# Patient Record
Sex: Female | Born: 1945 | Race: White | Hispanic: No | Marital: Married
Health system: Southern US, Community
[De-identification: ages and names within clinical notes are randomized; demographics above are authoritative.]

---

## 2017-10-30 ENCOUNTER — Emergency Department (HOSPITAL_COMMUNITY): Payer: Medicare Other

## 2017-10-30 ENCOUNTER — Emergency Department (HOSPITAL_COMMUNITY)
Admission: EM | Admit: 2017-10-30 | Discharge: 2017-10-30 | Disposition: A | Discharge status: Home / Self Care | Payer: Medicare Other | Attending: Emergency Medicine | Admitting: Emergency Medicine

## 2017-10-30 DIAGNOSIS — Z944 Liver transplant status: Secondary | ICD-10-CM | POA: Insufficient documentation

## 2017-10-30 DIAGNOSIS — I2510 Atherosclerotic heart disease of native coronary artery without angina pectoris: Secondary | ICD-10-CM | POA: Insufficient documentation

## 2017-10-30 DIAGNOSIS — Z7984 Long term (current) use of oral hypoglycemic drugs: Secondary | ICD-10-CM | POA: Diagnosis not present

## 2017-10-30 DIAGNOSIS — Z79899 Other long term (current) drug therapy: Secondary | ICD-10-CM | POA: Diagnosis not present

## 2017-10-30 DIAGNOSIS — Z7982 Long term (current) use of aspirin: Secondary | ICD-10-CM | POA: Diagnosis not present

## 2017-10-30 DIAGNOSIS — E119 Type 2 diabetes mellitus without complications: Secondary | ICD-10-CM | POA: Diagnosis not present

## 2017-10-30 DIAGNOSIS — R55 Syncope and collapse: Secondary | ICD-10-CM | POA: Insufficient documentation

## 2017-10-30 DIAGNOSIS — I10 Essential (primary) hypertension: Secondary | ICD-10-CM | POA: Insufficient documentation

## 2017-10-30 LAB — I-STAT TROPONIN, ED
Troponin i, poc: 0 ng/mL (ref 0.00–0.08)
Troponin i, poc: 0 ng/mL (ref 0.00–0.08)

## 2017-10-30 LAB — COMPREHENSIVE METABOLIC PANEL
ALBUMIN: 3.5 g/dL (ref 3.5–5.0)
ALT: 20 U/L (ref 14–54)
AST: 26 U/L (ref 15–41)
Alkaline Phosphatase: 77 U/L (ref 38–126)
Anion gap: 13 (ref 5–15)
BUN: 11 mg/dL (ref 6–20)
CHLORIDE: 93 mmol/L — AB (ref 101–111)
CO2: 28 mmol/L (ref 22–32)
CREATININE: 1.1 mg/dL — AB (ref 0.44–1.00)
Calcium: 9.3 mg/dL (ref 8.9–10.3)
GFR calc Af Amer: 58 mL/min — ABNORMAL LOW (ref >60)
GFR calc non Af Amer: 50 mL/min — ABNORMAL LOW (ref >60)
GLUCOSE: 101 mg/dL — AB (ref 65–99)
POTASSIUM: 4 mmol/L (ref 3.5–5.1)
Sodium: 134 mmol/L — ABNORMAL LOW (ref 135–145)
Total Bilirubin: 1 mg/dL (ref 0.3–1.2)
Total Protein: 7.3 g/dL (ref 6.5–8.1)

## 2017-10-30 LAB — CBC WITH DIFFERENTIAL/PLATELET
Basophils Absolute: 0 10*3/uL (ref 0.0–0.1)
Basophils Relative: 0 %
EOS ABS: 0.2 10*3/uL (ref 0.0–0.7)
EOS PCT: 1 %
HCT: 36 % (ref 36.0–46.0)
Hemoglobin: 12.1 g/dL (ref 12.0–15.0)
LYMPHS ABS: 1.2 10*3/uL (ref 0.7–4.0)
LYMPHS PCT: 8 %
MCH: 30.6 pg (ref 26.0–34.0)
MCHC: 33.6 g/dL (ref 30.0–36.0)
MCV: 90.9 fL (ref 78.0–100.0)
MONOS PCT: 8 %
Monocytes Absolute: 1.3 10*3/uL — ABNORMAL HIGH (ref 0.1–1.0)
Neutro Abs: 13.2 10*3/uL — ABNORMAL HIGH (ref 1.7–7.7)
Neutrophils Relative %: 83 %
PLATELETS: 196 10*3/uL (ref 150–400)
RBC: 3.96 MIL/uL (ref 3.87–5.11)
RDW: 13.6 % (ref 11.5–15.5)
WBC: 15.9 10*3/uL — ABNORMAL HIGH (ref 4.0–10.5)

## 2017-10-30 LAB — I-STAT CG4 LACTIC ACID, ED: LACTIC ACID, VENOUS: 1.39 mmol/L (ref 0.5–1.9)

## 2017-10-30 MED ORDER — AMOXICILLIN-POT CLAVULANATE 875-125 MG PO TABS: 1.0000 | ORAL_TABLET | Freq: Two times a day (BID) | ORAL | 0 refills | Status: AC

## 2017-10-30 MED ORDER — ACETAMINOPHEN 325 MG PO TABS: 650.0000 mg | ORAL_TABLET | Freq: Once | ORAL | Status: DC

## 2017-10-30 MED ORDER — VANCOMYCIN HCL IN DEXTROSE 750-5 MG/150ML-% IV SOLN
750.0000 mg | Freq: Two times a day (BID) | INTRAVENOUS | Status: DC
  Filled 2017-10-30: qty 150

## 2017-10-30 MED ORDER — VANCOMYCIN HCL IN DEXTROSE 1-5 GM/200ML-% IV SOLN: 1000.0000 mg | Freq: Once | INTRAVENOUS | Status: DC

## 2017-10-30 MED ORDER — SODIUM CHLORIDE 0.9 % IV SOLN
2.0000 g | Freq: Once | INTRAVENOUS | Status: AC
  Administered 2017-10-30: 2 g via INTRAVENOUS
  Filled 2017-10-30: qty 2

## 2017-10-30 MED ORDER — SODIUM CHLORIDE 0.9 % IV SOLN: 2.0000 g | INTRAVENOUS | Status: DC

## 2017-10-30 MED ORDER — VANCOMYCIN HCL 10 G IV SOLR
1250.0000 mg | Freq: Once | INTRAVENOUS | Status: AC
  Administered 2017-10-30: 1250 mg via INTRAVENOUS
  Filled 2017-10-30: qty 1250

## 2017-10-30 NOTE — ED Notes (Signed)
Patient transported to X-ray 

## 2017-10-30 NOTE — ED Notes (Signed)
Family at bedside. 

## 2017-10-30 NOTE — Discharge Instructions (Signed)
Please take all of your antibiotics until finished!   You may develop abdominal discomfort or diarrhea from the antibiotic.  You may help offset this with probiotics which you can buy or get in yogurt. Do not eat  or take the probiotics until 2 hours after your antibiotic.  Alternate ibuprofen and Tylenol as needed for fever.  Drink plenty of water and get plenty of rest.  [facility] for [facility]evaluation as soon as possible.  Return to the emergency department immediately for any concerning signs or symptoms develop such as persisting fevers, worsening lightheadedness or syncope, shortness of breath/worsening cough, or chest pain.

## 2017-10-30 NOTE — ED Provider Notes (Signed)
[facility] EMERGENCY DEPARTMENT Provider Note   CSN: [id number] Arrival date & time: 10/30/17  1245     History   Chief Complaint Chief Complaint  Patient presents with  . Near Syncope    HPI Joanna Mcdaniel is a 72 y.o. female with history of CAD, HLD, HTN, status post liver transplant, interstitial lung disease, type 2 diabetes presents today for evaluation of acute onset, progressively improved episode of lightheadedness which began at around noon.  She states that she was eating lunch when she became acutely nauseated and lightheaded.  She denies actual syncope.  She notes frontal headache which has been ongoing for 3 days which she states is similar to headaches she has had in the past and improves with Tylenol.  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  She notes that she developed a fever of around 102F this morning which improved with Tylenol.  She denies shortness of breath, chest pain, abdominal pain, or vomiting.  No urinary symptoms.  She does have a cough at baseline due to her interstitial lung disease and notes no change in her chronic cough.  Of note, she tells me that she frequently has "fainting spells" that feel somewhat similar to the episode she experienced prior to arrival.  She notes that they are typically associated with her medications especially metoprolol.  She notes that she had not had anything to eat before her episode began.  The history is provided by the patient.    No past medical history on file.  There are no active problems to display for this patient.   OB History    No data available       Home Medications    Prior to Admission medications   Medication Sig Start Date End Date Taking? Authorizing Provider  acetaminophen (TYLENOL) 500 MG tablet Take 1,000 mg by mouth every 6 (six) hours as needed for  fever or headache (pain).   Yes [provider]  ALPRAZolam Prudy Feeler) 0.5 MG tablet Take 0.25-0.5 mg by mouth daily as needed for anxiety.  10/11/17  Yes [provider]  aspirin EC 325 MG tablet Take 325 mg by mouth at bedtime.   Yes [provider]  b complex vitamins tablet Take 1 tablet by mouth daily.   Yes [provider]  cholecalciferol (VITAMIN D) 1000 units tablet Take 1,000 Units by mouth daily.   Yes [provider]  diphenhydrAMINE (BENADRYL) 25 mg capsule Take 50 mg by mouth at bedtime.   Yes [provider]  docusate sodium (COLACE) 250 MG capsule Take 250 mg by mouth at bedtime.   Yes [provider]  ezetimibe (ZETIA) 10 MG tablet Take 10 mg by mouth at bedtime. 08/26/17  Yes [provider]  Flaxseed, Linseed, (FLAXSEED OIL) 1200 MG CAPS Take 1,200 mg by mouth daily.   Yes [provider]  FLUoxetine (PROZAC) 20 MG capsule Take 20 mg by mouth daily.   Yes [provider]  glimepiride (AMARYL) 2 MG tablet Take 2 mg by mouth daily.   Yes [provider]  hydrochlorothiazide (HYDRODIURIL) 25 MG tablet Take 25 mg by mouth daily.   Yes [provider]  ibuprofen (ADVIL,MOTRIN) 200 MG tablet Take 400 mg by mouth every 6 (six) hours as needed for headache (pain).   Yes [provider]  losartan (COZAAR) 50 MG tablet Take 50 mg  by mouth at bedtime.   Yes [provider]  Magnesium 400 MG TABS Take 400 mg by mouth daily.   Yes [provider]  metoprolol tartrate (LOPRESSOR) 25 MG tablet Take 12.5 mg by mouth at bedtime.   Yes [provider]  niacin 500 MG tablet Take 250 mg by mouth 2 (two) times daily.   Yes [provider]  omeprazole (PRILOSEC) 40 MG capsule Take 40 mg by mouth at bedtime.   Yes [provider]  oxyCODONE (OXY IR/ROXICODONE) 5 MG immediate release tablet Take 5-10 mg by mouth every 4 (four) hours as needed (pain).   10/11/17  Yes [provider]  Pirfenidone (ESBRIET) 267 MG TABS Take 534 mg by mouth 3 (three) times daily.   Yes [provider]  rosuvastatin (CRESTOR) 5 MG tablet Take 5 mg by mouth at bedtime.   Yes [provider]  tacrolimus (PROGRAF) 0.5 MG capsule Take 0.5-1 mg by mouth See admin instructions. Take 2 capsules (1 mg) by mouth every morning and take 1 capsule (0.5 mg) at night 10/25/17  Yes [provider]  triamcinolone cream (KENALOG) 0.1 % Apply 1 application topically 2 (two) times daily as needed (breakout/rash).  10/24/09  Yes [provider]  vitamin E 400 UNIT capsule Take 400 Units by mouth daily.   Yes [provider]  amoxicillin-clavulanate (AUGMENTIN) 875-125 MG tablet Take 1 tablet by mouth every 12 (twelve) hours for 5 days. 10/30/17 11/04/17  Jeanie Sewer, PA-C    Family History No family history on file.  Social History Social History   Tobacco Use  . Smoking status: Not on file  Substance Use Topics  . Alcohol use: Not on file  . Drug use: Not on file     Allergies   Codeine; Lipitor [atorvastatin]; Morphine and related; and Sulfa antibiotics   Review of Systems Review of Systems  Constitutional: Positive for chills and fever.  Respiratory: Negative for shortness of breath.   Cardiovascular: Negative for chest pain.  Gastrointestinal: Positive for nausea. Negative for abdominal pain.  Neurological: Positive for light-headedness. Negative for syncope.  All other systems reviewed and are negative.    Physical Exam Updated Vital Signs BP (!) 162/59   Pulse 70   Temp (!) 100.7 F (38.2 C) (Oral)   Resp 14   SpO2 98%   Physical Exam  Constitutional: She is oriented to person, place, and time. She appears well-developed and well-nourished. No distress.  HENT:  Head: Normocephalic and atraumatic.  Eyes: Conjunctivae and EOM are normal. Pupils are equal, round, and reactive to light. Right eye exhibits  no discharge. Left eye exhibits no discharge.  Neck: Normal range of motion. Neck supple. No JVD present. No tracheal deviation present.  Cardiovascular: Normal rate, regular rhythm, normal heart sounds and intact distal pulses.  Pulmonary/Chest: Effort normal. She exhibits no tenderness.  Diffuse crackles in the bilateral bases posteriorly  Abdominal: Soft. She exhibits no distension. There is no tenderness.  Musculoskeletal: Normal range of motion. She exhibits no edema or tenderness.  Neurological: She is alert and oriented to person, place, and time. No cranial nerve deficit or sensory deficit. She exhibits normal muscle tone.  Fluent speech with no evidence of dysarthria or aphasia, no facial droop, sensation intact to soft touch of extremities.  Cranial nerves II through XII tested and intact.  Ambulates with normal gait and balance  Skin: Skin is warm and dry. No erythema.  Psychiatric: She has a  normal mood and affect. Her behavior is normal.  Nursing note and vitals reviewed.    ED Treatments / Results  Labs (all labs ordered are listed, but only abnormal results are displayed) Labs Reviewed  COMPREHENSIVE METABOLIC PANEL - Abnormal; Notable for the following components:      Result Value   Sodium 134 (*)    Chloride 93 (*)    Glucose, Bld 101 (*)    Creatinine, Ser 1.10 (*)    GFR calc non Af Amer 50 (*)    GFR calc Af Amer 58 (*)    All other components within normal limits  CBC WITH DIFFERENTIAL/PLATELET - Abnormal; Notable for the following components:   WBC 15.9 (*)    Neutro Abs 13.2 (*)    Monocytes Absolute 1.3 (*)    All other components within normal limits  CULTURE, BLOOD (ROUTINE X 2)  CULTURE, BLOOD (ROUTINE X 2)  URINALYSIS, ROUTINE W REFLEX MICROSCOPIC  I-STAT CG4 LACTIC ACID, ED  I-STAT TROPONIN, ED  I-STAT TROPONIN, ED  CBG MONITORING, ED    EKG  EKG Interpretation  Date/Time:  Saturday October 30 2017 12:54:14 EST Ventricular Rate:  69 PR  Interval:    QRS Duration: 103 QT Interval:  418 QTC Calculation: 448 R Axis:   77 Text Interpretation:  Sinus rhythm Borderline repolarization abnormality no STEMI no old comparison Confirmed by Arby BarrettePfeiffer, Marcy [id number]) on 10/30/2017 12:56:59 PM       Radiology Dg Chest 2 View  Result Date: 10/30/2017 CLINICAL DATA:  Fever. EXAM: CHEST - 2 VIEW COMPARISON:  None. FINDINGS: Low volume chest with coarse interstitial opacities. Patient has history of interstitial lung disease. Previous thoracotomy with CABG. No air bronchograms, Kerley lines, effusion, or pneumothorax. T12 corpectomy with thoracolumbar rod and pedicle screws. Cervical spine fusion. No acute osseous finding. Artifact from EKG leads. IMPRESSION: Low volume chest with interstitial opacities presumably related to history of interstitial lung disease. No suspected superimposed acute disease, but limited without comparisons. Electronically Signed   By: Marnee SpringJonathon  Watts M.D.   On: 10/30/2017 13:54    Procedures Procedures (including critical care time)  Medications Ordered in ED Medications  vancomycin (VANCOCIN) 1,250 mg in sodium chloride 0.9 % 250 mL IVPB (not administered)  vancomycin (VANCOCIN) IVPB 750 mg/150 ml premix (not administered)  ceFEPIme (MAXIPIME) 2 g in sodium chloride 0.9 % 100 mL IVPB (not administered)  acetaminophen (TYLENOL) tablet 650 mg (not administered)  ceFEPIme (MAXIPIME) 2 g in sodium chloride 0.9 % 100 mL IVPB (2 g Intravenous New Bag/Given 10/30/17 1626)     Initial Impression / Assessment and Plan / ED Course  I have reviewed the triage vital signs and the nursing notes.  Pertinent labs & imaging results that were available during my care of the patient were reviewed by me and considered in my medical decision making (see chart for details).     Patient presents for evaluation of near syncopal episode.  Has a history of the same in the past.  She has a low-grade fever but otherwise vital signs are  stable.  She is nontoxic in appearance.  No syncope, no shortness of breath.  No focal neurologic deficits.  Examination of the abdomen is benign.  She does have a leukocytosis with increased absolute neutrophils but lab work is otherwise reassuring.  Lactate is normal.  Serial troponins are negative and EKG shows no evidence of ST segment abnormality, ischemia, or arrhythmia.  I doubt this is an anginal equivalent suggestive  of ACS or MI.  Chest x-ray shows interstitial opacities presumably consistent with her history of interstitial lung disease but no definitive evidence of superimposed disease such as pneumonia.  Doubt PE.  With fever and recent procedure, concern for potential postprocedural pneumonia versus other infectious source.  Patient's GI doctor and primary care physician or with [facility].  She would prefer to follow-up with them.  She was given IV antibiotics in the ED but we will discharge her with Augmentin for which she has already been on for 3 days per her GI doctor's instructions.  She will follow-up with her GI doctor on Monday in 2 days or return to the ED if any concerning signs or symptoms developed.  We discussed strict ED return precautions.  Patient seen and evaluated by Dr. Clarice Pole who agrees with assessment and plan at this time.  Patient and patient's family verbalized understanding of and agreement with plan and patient is stable for discharge home at this time.  Final Clinical Impressions(s) / ED Diagnoses   Final diagnoses:  Near syncope    ED Discharge Orders        Ordered    amoxicillin-clavulanate (AUGMENTIN) 875-125 MG tablet  Every 12 hours     10/30/17 1639       Jeanie Sewer, PA-C 10/31/17 9147    Arby Barrette, MD 11/01/17 [phone]

## 2017-10-30 NOTE — ED Provider Notes (Signed)
Medical screening examination/treatment/procedure(s) were conducted as a shared visit with non-physician practitioner(s) and myself.  I personally evaluated the patient during the encounter.   EKG Interpretation  Date/Time:  Saturday October 30 2017 12:54:14 EST Ventricular Rate:  69 PR Interval:    QRS Duration: 103 QT Interval:  418 QTC Calculation: 448 R Axis:   77 Text Interpretation:  Sinus rhythm Borderline repolarization abnormality no STEMI no old comparison Confirmed by Arby BarrettePfeiffer, Rannie Craney [id number]) on 10/30/2017 12:56:59 PM     [facility].  She reports that she awakened this morning early hours to go to the bathroom and noted that she had a fever.  She reports her temperature was measured up to 102.  She however did not have any severe associated symptoms.  She reports she has had some mild degree of upper abdominal distention which she was told would be typical.  She does not feel that the pain is severe.  She and her husband went out to eat and while she was sitting in the booth, before she was able to get anything to eat she started to feel lightheaded.  Patient reports she could feel her vision closing in but due to being seated in the booth was unable to lie down and get her feet upright.  (She reports she has a history of some frequency of syncopal episodes that have been evaluated and often are associated with her medications).  She did not have any associated chest pain or dyspnea.  She reports once EMS arrived and she was able to get supine with her feet elevated she started to feel better quite rapidly.  Patient is alert and nontoxic.  Mental status is clear.  No respiratory distress.  Heart regular no gross rub murmur gallop.  Chest very fine diffuse crackle (patient has known pulmonary fibrosis and reports this is typical) abdomen is soft.  No guarding.  She endorses minimal epigastric or upper abdominal tenderness to deep palpation.   No lower extremity edema or calf tenderness.  The patient has been taking Augmentin that was prescribed after the procedure.  We discussed management options.  Patient did have procedure yesterday that involved biopsy.  There is some concern with her developing fever.  (Patient describes a syncopal episode under the circumstances as being a fairly typical event for her.  She had taken her medications and not had a chance to eat).  Patient does not have pain that would be highly suggestive of biopsy complication.  No lactic acidosis, blood pressures are normotensive to borderline hypertensive and heart rates are normal.  At this time picture does not suggest sepsis.  Patient is retired Engineer, civil (consulting)nurse.  At this time we have made some shared decision making around her plan.  Blood cultures have been obtained.  She will be given 1 dose of Maxipime in the emergency department.  She will return home and continue her prescribed Augmentin.  Patient will return if she develops any concerning symptoms or recurrence of fever.  Otherwise she will contact [facility] on Monday.   Arby BarrettePfeiffer, Armani Brar, MD 10/30/17 [phone]

## 2017-10-30 NOTE — Progress Notes (Signed)
Pharmacy Antibiotic Note  Joanna Mcdaniel is a 72 y.o. female admitted on 10/30/2017 with pneumonia.  Pharmacy has been consulted for vancomycin/cefepime dosing. Recent weight from outside hospital was 77.7 kg. Scr-1.10 CrCl ~ 47 ml/min. WBC up to 15.9 and Tmax -100.7.   Plan:  Vancomycin 1250 mg X 1 Then 750 mg every 12 hours Cefepime 2 gm every 24 hours  Monitor renal function and adjust doses as appropriate Monitor clinical s/sx of infection     Temp (24hrs), Avg:100.7 F (38.2 C), Min:100.7 F (38.2 C), Max:100.7 F (38.2 C)  Recent Labs  Lab 10/30/17 1300 10/30/17 1317  WBC 15.9*  --   CREATININE 1.10*  --   LATICACIDVEN  --  1.39    CrCl cannot be calculated (Unknown ideal weight.).    Allergies  Allergen Reactions  . Codeine Itching and Nausea And Vomiting  . Lipitor [Atorvastatin] Other (See Comments)    Elevated LFT's  . Morphine And Related Other (See Comments)    hallucinations  . Sulfa Antibiotics Rash    Antimicrobials this admission: 3/9 Vanc>> 3/9 Cefepime>>    Thank you for allowing pharmacy to be a part of this patient's care.  Sharin MonsEmily Sinclair, PharmD, BCPS PGY2 Infectious Diseases Pharmacy Resident Pager: [phone]  10/30/2017 3:30 PM

## 2017-10-30 NOTE — ED Triage Notes (Addendum)
Per EMS- pt was sitting at restaurant and felt nauseated, then had a near syncopal episode. Pt has hx of MI and states she feels similar to one she has had in the past. Pt also reports she had an endoscopy with a biopsy on Thursday. States she has not felt good since the biopsy, pt is a liver transplant patient. [facility]. EMS gave 4mg  of Zofran for nausea.

## 2017-11-04 LAB — CULTURE, BLOOD (ROUTINE X 2)
Culture: NO GROWTH
Culture: NO GROWTH
Special Requests: ADEQUATE
Special Requests: ADEQUATE

## 2019-08-01 IMAGING — CR DG CHEST 2V
2 series · 2 of 2 positions shown · non-contrast
Comparison: None.

CLINICAL DATA: Fever.

EXAM:
CHEST - 2 VIEW

[chest lat]
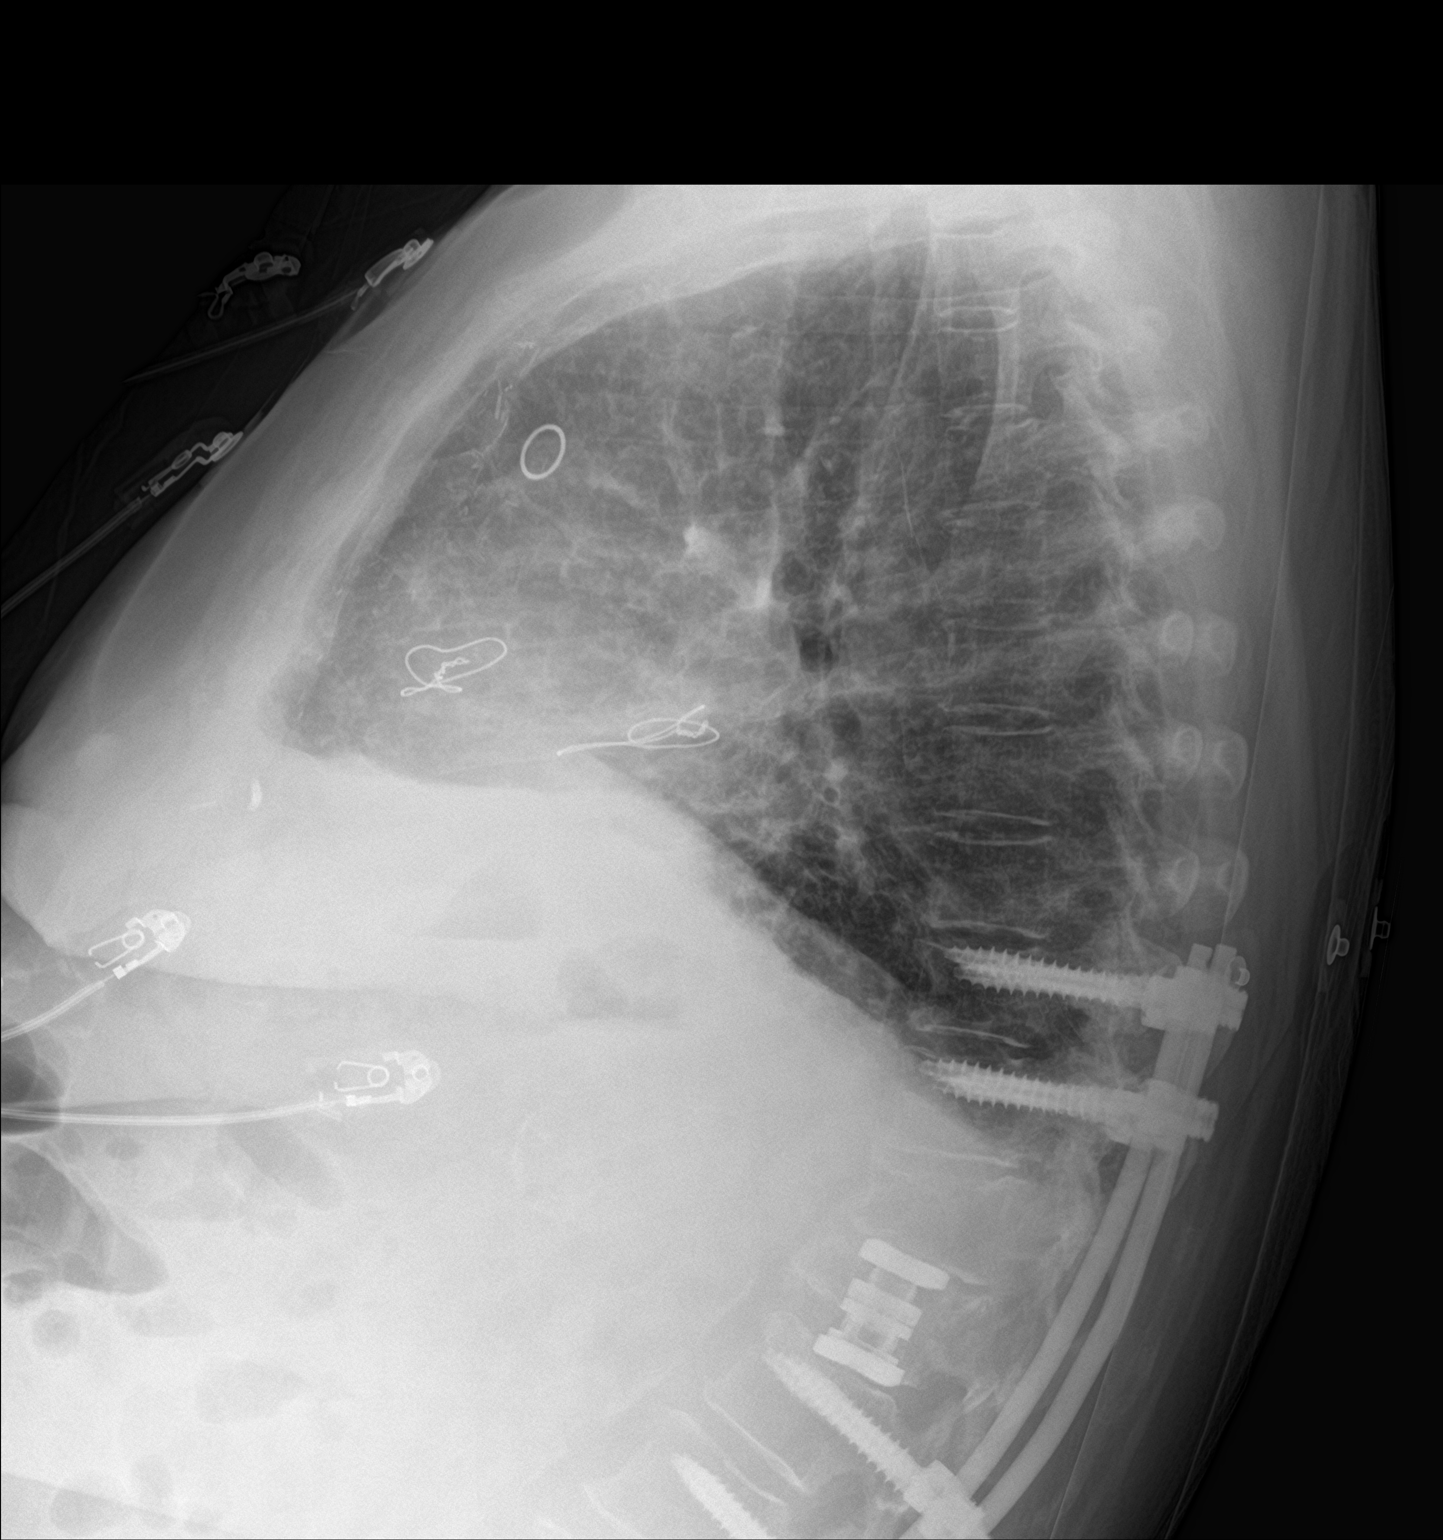

[chest ap]
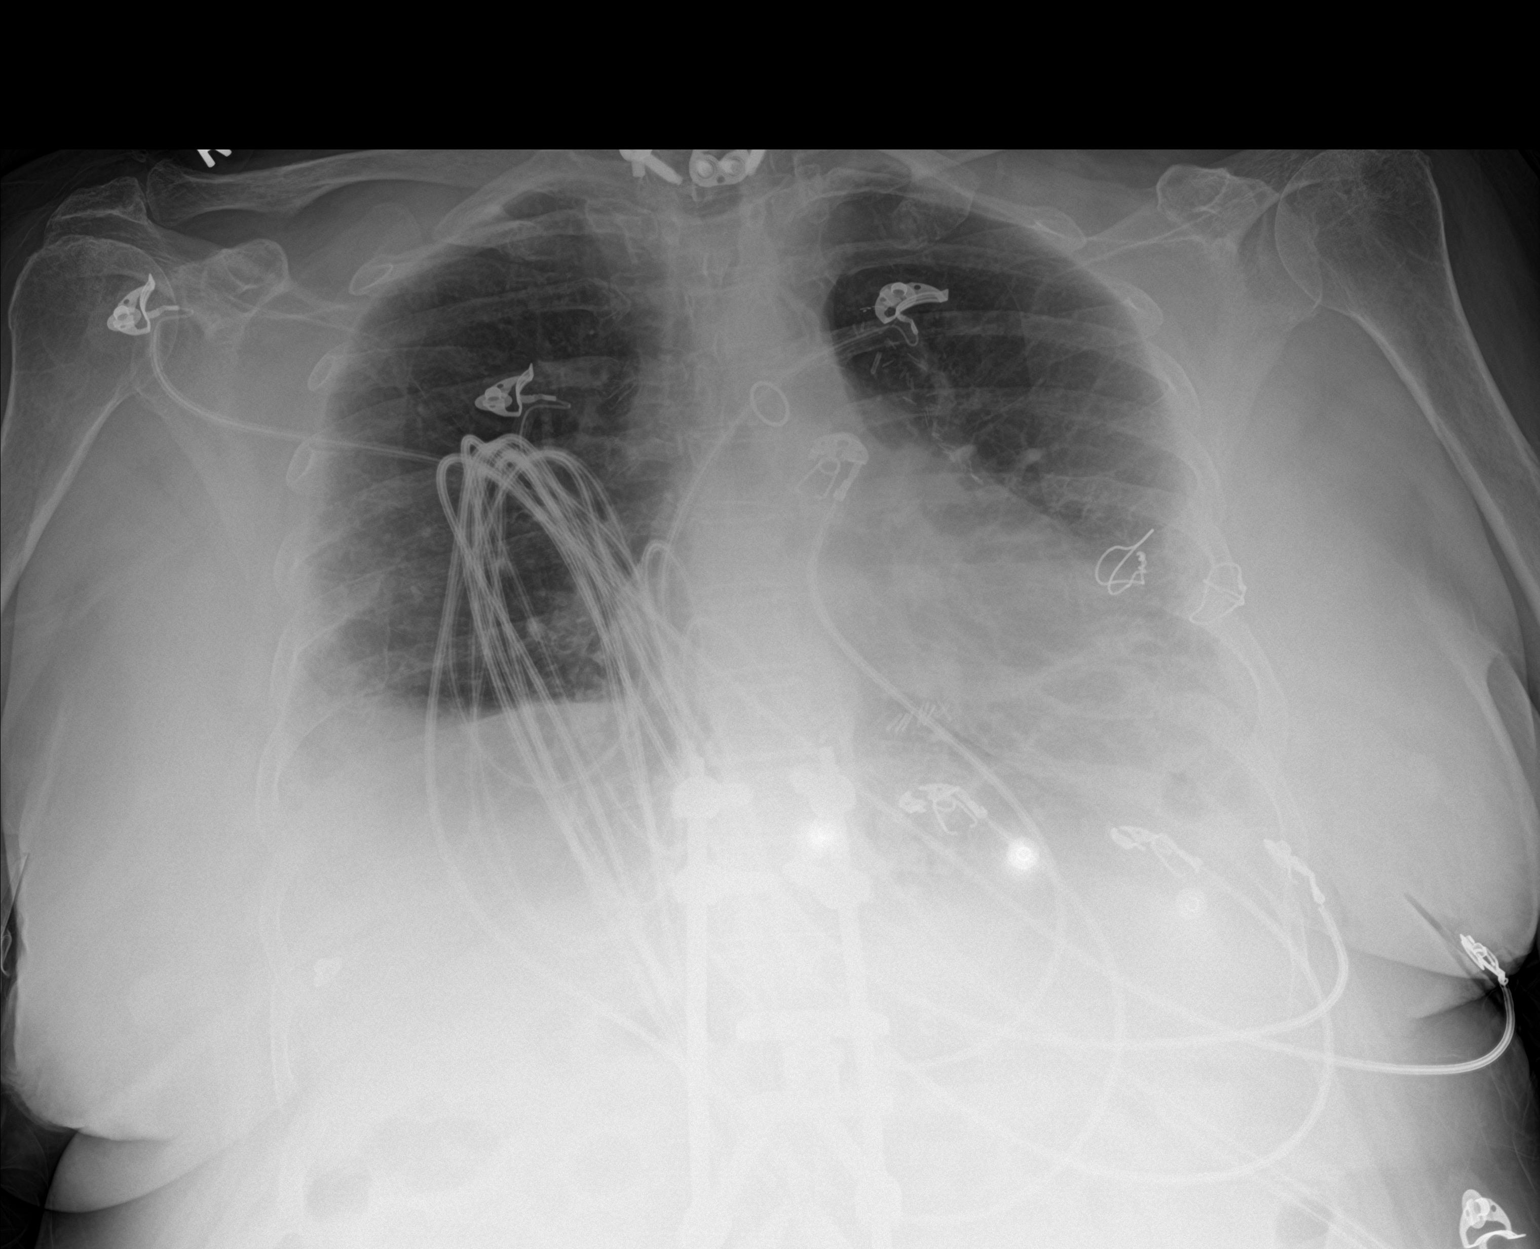

[2 of 2 positions shown; findings below may reference images not displayed]

FINDINGS: Low volume chest with coarse interstitial opacities. Patient has
history of interstitial lung disease. Previous thoracotomy with
CABG. No air bronchograms, Kerley lines, effusion, or pneumothorax.

T12 corpectomy with thoracolumbar rod and pedicle screws. Cervical
spine fusion. No acute osseous finding.

Artifact from EKG leads.
IMPRESSION: Low volume chest with interstitial opacities presumably related to
history of interstitial lung disease. No suspected superimposed
acute disease, but limited without comparisons.
